# Patient Record
Sex: Female | Born: 1995 | Race: Black or African American
Health system: Northeastern US, Academic
[De-identification: ages and names within clinical notes are randomized; demographics above are authoritative.]

## PROBLEM LIST (undated history)

## (undated) DIAGNOSIS — F32A Depression, unspecified: Secondary | ICD-10-CM

## (undated) DIAGNOSIS — F4310 Post-traumatic stress disorder, unspecified: Secondary | ICD-10-CM

## (undated) DIAGNOSIS — G8929 Other chronic pain: Secondary | ICD-10-CM

## (undated) DIAGNOSIS — T8859XA Other complications of anesthesia, initial encounter: Secondary | ICD-10-CM

## (undated) DIAGNOSIS — J45909 Unspecified asthma, uncomplicated: Secondary | ICD-10-CM

## (undated) DIAGNOSIS — Z6841 Body Mass Index (BMI) 40.0 and over, adult: Secondary | ICD-10-CM

## (undated) DIAGNOSIS — F419 Anxiety disorder, unspecified: Secondary | ICD-10-CM

## (undated) DIAGNOSIS — N946 Dysmenorrhea, unspecified: Secondary | ICD-10-CM

## (undated) DIAGNOSIS — M93003 Unspecified slipped upper femoral epiphysis (nontraumatic), unspecified hip: Secondary | ICD-10-CM

## (undated) DIAGNOSIS — E282 Polycystic ovarian syndrome: Secondary | ICD-10-CM

## (undated) DIAGNOSIS — A549 Gonococcal infection, unspecified: Secondary | ICD-10-CM

## (undated) DIAGNOSIS — L910 Hypertrophic scar: Secondary | ICD-10-CM

## (undated) DIAGNOSIS — N915 Oligomenorrhea, unspecified: Secondary | ICD-10-CM

## (undated) HISTORY — DX: Unspecified asthma, uncomplicated: J45.909

## (undated) HISTORY — DX: Other complications of anesthesia, initial encounter: T88.59XA

## (undated) HISTORY — PX: TONSILLECTOMY: SUR1361

## (undated) HISTORY — PX: HIP SURGERY: SHX245

## (undated) HISTORY — PX: HERNIA REPAIR: SHX51

## (undated) HISTORY — PX: SLIPPED CAPITAL FEMORAL EPIPHYSIS PINNING: SHX391

## (undated) HISTORY — DX: Body Mass Index (BMI) 40.0 and over, adult: Z684

## (undated) HISTORY — PX: HIP OSTEOTOMY: SHX984

## (undated) HISTORY — PX: ORTHOPEDIC SURGERY: SHX850

## (undated) HISTORY — DX: Depression, unspecified: F32.A

## (undated) HISTORY — DX: Dysmenorrhea, unspecified: N94.6

## (undated) HISTORY — DX: Gonococcal infection, unspecified: A54.9

## (undated) HISTORY — DX: Polycystic ovarian syndrome: E28.2

## (undated) HISTORY — PX: TONSILLECTOMY: SHX28A

## (undated) HISTORY — DX: Hypertrophic scar: L91.0

## (undated) HISTORY — DX: Oligomenorrhea, unspecified: N91.5

## (undated) HISTORY — DX: Unspecified slipped upper femoral epiphysis (nontraumatic), unspecified hip: M93.003

---
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
## 2016-01-04 ENCOUNTER — Ambulatory Visit: Payer: Self-pay | Admitting: Optometry

## 2016-01-04 NOTE — Progress Notes (Signed)
This encounter was created in error - please disregard.

## 2016-01-13 ENCOUNTER — Ambulatory Visit: Payer: Medicaid Other | Admitting: Primary Care

## 2016-01-14 ENCOUNTER — Encounter: Payer: Self-pay | Admitting: Primary Care

## 2016-03-03 ENCOUNTER — Ambulatory Visit: Payer: Self-pay | Admitting: Optometry

## 2016-03-03 ENCOUNTER — Encounter: Payer: Self-pay | Admitting: Optometry

## 2016-03-03 DIAGNOSIS — H53022 Refractive amblyopia, left eye: Secondary | ICD-10-CM

## 2016-03-03 DIAGNOSIS — H52203 Unspecified astigmatism, bilateral: Secondary | ICD-10-CM

## 2016-03-03 DIAGNOSIS — H5213 Myopia, bilateral: Secondary | ICD-10-CM

## 2016-03-03 NOTE — Progress Notes (Signed)
Outpatient Visit      Patient name: Mckenzie [name]  DOB: 20-Oct-1995       Age: 20 y.o.  MR#: [id number]    Encounter Date: 03/03/2016    Subjective:      Chief Complaint   Patient presents with    Blurred Vision     HPI     NPV/LEE 1 yr  Pt states she has bad vision DV and NV even with glasses.  Poor historian.   States eyes hurt sometimes, not sure how to explain.  No DM. No hx of eye sx/no fam hx.  Would like eval for CL's, and/or Lasik.       Last edited by Sheppard Plumber, OD on 03/03/2016 12:02 PM.     has a current medication list which includes the following prescription(s): fluticasone, albuterol hfa, albuterol, montelukast, desonide, levonorgestrel, melatonin, biotin, aerochamber plus flo-vu, and ibuprofen.     is allergic to amoxicillin and mupirocin.      Past Medical History:   Diagnosis Date    Asthma     Depression     Dysmenorrhea     Keloid     Malpositioned IUD 01/2015    Oligomenorrhea     PCOS (polycystic ovarian syndrome)     SCFE (slipped capital femoral epiphysis)       Past Surgical History:   Procedure Laterality Date    ORTHOPEDIC SURGERY      multiple surgeries fir slipped femoral issues    PR INSERT INTRAUTERINE DEVICE N/A 02/08/2015    Procedure: IUD INSERTION w/USG, baby dialtors,  and end bx pipelle;  Surgeon: [name], MD;  Location: [facility] MAIN OR;  Service: OBGYN    TONSILLECTOMY      UMBILICAL HERNIA REPAIR  2000        Specialty Problems        Ophthalmology Problems    Meridional amblyopia, left        Myopia with astigmatism, bilateral               ROS     Positive for: Eyes    Last edited by Hauver, Amy on 03/03/2016 11:56 AM. (History)         Objective:     Base Eye Exam     Visual Acuity (Snellen - Linear)      Right Left   Dist cc 20/30 20/30 -1   Near cc J1 OU       Correction:  Glasses      Tonometry (Tonopen, 12:23 PM)      Right Left   Pressure 17 20         Pupils      Pupils APD   Right PERRLA None   Left PERRLA None         Visual Fields      Left  Right   Result Full Full         Extraocular Movement      Right Left   Result Full Full         Neuro/Psych     Oriented x3:  Yes    Mood/Affect:  Normal      Dilation     Both eyes:  2.5% Phenylephrine, 1.0% Tropicamide @ 12:23 PM            Slit Lamp and Fundus Exam     External Exam      Right Left    External Normal ocular adnexae, lacrimal  gland & drainage, orbits Normal ocular adnexae, lacrimal gland & drainage, orbits      Slit Lamp Exam      Right Left    Lids/Lashes Normal structure & position Normal structure & position    Conjunctiva/Sclera Normal bulbar/palpebral, conjunctiva, sclera Normal bulbar/palpebral, conjunctiva, sclera    Cornea Normal epithelium, stroma, endothelium, tear film Normal epithelium, stroma, endothelium, tear film    Anterior Chamber Clear & deep Clear & deep    Iris Normal shape, size, morphology Normal shape, size, morphology    Lens Normal cortex, nucleus, anterior/posterior capsule, clarity Normal cortex, nucleus, anterior/posterior capsule, clarity    Vitreous Clear Clear      Fundus Exam      Right Left    Disc Normal size, appearance, nerve fiber layer Normal size, appearance, nerve fiber layer    C/D Ratio 0.15 0.25    Macula Normal Normal    Vessels Normal Normal    Periphery Normal Normal            Refraction     Wearing Rx      Sphere Cylinder Axis   Right -2.50 -0.75 003   Left -2.50 -2.50 003       Age:  [age]yr    Type:  SVL      Manifest Refraction      Sphere Cylinder Axis Dist   Right -2.75 -0.75 175 20/20   Left -3.00 -2.50 006 20/30+         Final Rx      Sphere Cylinder Axis   Right -2.75 -0.75 175   Left -3.00 -2.50 006       Type:  SVL    Expiration Date:  03/04/2018              Final Rx      Sphere Cylinder Axis   Right -2.75 -0.75 175   Left -3.00 -2.50 006       Type:  SVL    Expiration Date:  03/04/2018                No annotated images are attached to the encounter.      Assessment/Plan:      1. Myopia with astigmatism, bilateral     2. Meridional amblyopia,  left          PLAN:    1-2. Pt given spec Rx. Monitor x 1 yr  Dilated fundus exam unremarkable OU. Monitor

## 2016-06-23 ENCOUNTER — Ambulatory Visit: Payer: Medicaid Other | Attending: Primary Care | Admitting: Primary Care

## 2016-06-23 ENCOUNTER — Encounter: Payer: Self-pay | Admitting: Primary Care

## 2016-06-23 VITALS — BP 124/82 | HR 66 | Temp 97.7°F | Ht 64.0 in | Wt 280.0 lb

## 2016-06-23 DIAGNOSIS — R1030 Lower abdominal pain, unspecified: Secondary | ICD-10-CM | POA: Insufficient documentation

## 2016-06-23 LAB — POCT URINE PREGNANCY: Lot #: 171151

## 2016-06-23 MED ORDER — PROMETHAZINE HCL 12.5 MG PO TABS *I*
12.5000 mg | ORAL_TABLET | ORAL | 0 refills | Status: DC | PRN
Start: 2016-06-23 — End: 2016-06-27

## 2016-06-23 NOTE — Progress Notes (Signed)
[facility]   [facility] Note    Reason For Visit: No chief complaint on file.      Subjective:      Mckenzie [name] is 20 y.o. year old female with PCO S, history of lower back pain, prior abdominal pain in May of this year presenting today with 1 week of worsening lower abdominal pain.    She notes the pain is somewhat different than what she experienced in May.  It initially started in the mid/upper abdomen as crampy discomfort approximately one week ago.  However, over the last 3-4 days she has noted increased anorexia as well as nausea with some vomiting (no hematemesis).  Pain has migrated more over the suprapubic region, and continues to be quite crampy.  She denies any diarrhea, and in fact bowel movements have been normal--though less in the setting of eating less.  She reports the urination has been normal with no urinary frequency, hematuria, dysuria, incomplete voiding.  She denies any fevers, chills or sweats.    She does currently have an IUD, which was placed in late 2016 as a replacement for a previously migrated IUD.  She does note concerned that this may be related to a similar symptom.  In fact, over the last 2 days she has noted some vaginal spotting.  Otherwise no discharge.  She does have unprotected intercourse, but no concerns for STI.    Medications:     Current Outpatient Prescriptions on File Prior to Visit   Medication Sig Dispense Refill    fluticasone (FLOVENT HFA) 110 MCG/ACT inhaler Inhale 1 puff into the lungs 2 times daily 1 Inhaler 5    albuterol HFA 108 (90 BASE) MCG/ACT inhaler Inhale 1-2 puffs into the lungs every 4-6 hours as needed for Shortness of Breath (or cough)   Shake well before each use. 1 Inhaler 2    albuterol (PROVENTIL) (2.5 mg/433mL) 0.083% nebulizer solution Take 3 mLs (2.5 mg total) by nebulization every 4 hours as needed for Shortness of Breath 75 mL 2    montelukast (SINGULAIR) 10 MG tablet Take 1 tablet (10 mg total) by mouth nightly 30 tablet  2    desonide (DESOWEN) 0.05 % cream       MELATONIN PO Take by mouth      BIOTIN PO Take by mouth      Spacer/Aero-Holding Chambers (AEROCHAMBER PLUS FLO-VU) MISC       ibuprofen (ADVIL,MOTRIN) 800 MG tablet Take 800 mg by mouth 3 times daily as needed      Levonorgestrel (MIRENA IU) by Intrauterine route       No current facility-administered medications on file prior to visit.        Medications were reviewed and reconciled today.   Allergies:     Allergies   Allergen Reactions    Amoxicillin     Mupirocin Other (See Comments)     Skin rash and burning sensation       Review of Systems:     Pertinent positives and negatives as per HPI.     Physical Exam:     Vitals:    06/23/16 1104   BP: 124/82   Pulse: 66   Temp: 36.5 C (97.7 F)   SpO2: 98%     Wt Readings from Last 3 Encounters:   12/02/15 114 kg (251 lb 6.4 oz) (>99 %, Z= 2.50)*   11/23/15 114.6 kg (252 lb 9.6 oz) (>99 %, Z= 2.50)*   07/15/15 114.3  kg (252 lb) (>99 %, Z= 2.48)*     * Growth percentiles are based on CDC 2-20 Years data.     BP Readings from Last 3 Encounters:   06/23/16 124/82   12/02/15 116/78   11/23/15 122/70       General:  mildly uncomfortable, though alert, oriented, conversational and nontoxic appearing centrally obese female.  Eyes: Anicteric with normal conjunctiva.  Pupils equally round.  ENMT: Normal pinnae.  Acuity to conversational tones is good.   Neck: Trachea midline.   Pulmonary: Clear to auscultation bilaterally with good air movement. No wheezes, rhonchi, or rales.  Respirations unlabored.  Cardiovascular:  Regular rate and rhythm. No murmurs, rubs, or gallops. No pedal edema.  Abdominal: Soft.  Bowel sounds hypoactive.  Mild to moderate epigastric tenderness and moderate suprapubic tenderness to deeper palpation.  There is no rebound and no guarding.  No Murphy sign.  Difficult to palpate for hepatosplenomegaly with body habitus.      Point-of-care urine pregnancy negative.    Assessment and Plan:     Mckenzie [name] was  seen today for abdominal pain.    Diagnoses and all orders for this visit:    Lower abdominal pain:  20 year old female with new spotting, abdominal pain and nausea and prior history of migrated IUD.  We will pursue ultrasound of the abdomen including uterus to evaluate for possible re-migration.  Pregnancy test today is negative.  Patient reports low personal concern for STI, and lack of systemic symptoms in addition to fairly benign abdominal exam is reassuring for lack of PID.  Urine gonorrhea/chlamydia sent regardless.  She was given promethazine by mouth 4 management of nausea, and will contact the office if she develops any signs of systemic symptoms such as fevers, chills or sweats or worsening pain.  If symptoms do worsen consider ER evaluation/cross-sectional imaging.  -     US abdominal complete; Future  -     POCT Urine Pregnancy  -     N. Gonorrhoeae DNA amplification (Urine-voided); Future  -     Chlamydia plasmid DNA amplification (Urine-voided); Future  -     promethazine (PHENERGAN) 12.5 MG tablet; Take 1 tablet (12.5 mg total) by mouth every 4-6 hours as needed for Nausea  -     US transvaginal; Future  -     Chlamydia plasmid DNA amplification (Urine-voided)  -     N. Gonorrhoeae DNA amplification (Urine-voided)      Follow up pending workup.     Mckenzie Siaaniel Guadalupe Nickless, MD 06/23/2016 11:09 AM

## 2016-06-26 ENCOUNTER — Other Ambulatory Visit: Payer: Self-pay | Admitting: Primary Care

## 2016-06-27 ENCOUNTER — Telehealth: Payer: Self-pay | Admitting: Primary Care

## 2016-06-27 ENCOUNTER — Encounter: Payer: Self-pay | Admitting: Primary Care

## 2016-06-27 ENCOUNTER — Other Ambulatory Visit: Payer: Self-pay | Admitting: Primary Care

## 2016-06-27 ENCOUNTER — Encounter: Payer: Self-pay | Admitting: Gastroenterology

## 2016-06-27 ENCOUNTER — Ambulatory Visit: Payer: Medicaid Other | Attending: Primary Care | Admitting: Primary Care

## 2016-06-27 VITALS — BP 108/62 | HR 66 | Temp 97.4°F | Resp 18 | Ht 64.17 in | Wt 279.0 lb

## 2016-06-27 DIAGNOSIS — R1030 Lower abdominal pain, unspecified: Secondary | ICD-10-CM

## 2016-06-27 DIAGNOSIS — N730 Acute parametritis and pelvic cellulitis: Secondary | ICD-10-CM

## 2016-06-27 LAB — N. GONORRHOEAE DNA AMPLIFICATION: N. gonorrhoeae DNA Amplification: POSITIVE — AB

## 2016-06-27 LAB — CHLAMYDIA PLASMID DNA AMPLIFICATION: Chlamydia Plasmid DNA Amplification: 0

## 2016-06-27 MED ORDER — CEFTRIAXONE SODIUM 250 MG IJ SOLR *I*
250.0000 mg | Freq: Once | INTRAMUSCULAR | Status: AC
Start: 2016-06-27 — End: 2016-06-27
  Administered 2016-06-27: 250 mg via INTRAMUSCULAR

## 2016-06-27 MED ORDER — PROMETHAZINE HCL 12.5 MG PO TABS *I*
12.5000 mg | ORAL_TABLET | ORAL | 0 refills | Status: DC | PRN
Start: 2016-06-27 — End: 2016-07-04

## 2016-06-27 MED ORDER — DOXYCYCLINE MONOHYDRATE 100 MG PO CAPS *I*
100.0000 mg | ORAL_CAPSULE | Freq: Two times a day (BID) | ORAL | 0 refills | Status: AC
Start: 2016-06-27 — End: 2016-07-11

## 2016-06-27 MED ORDER — CEFTRIAXONE IN LIDOCAINE 1% 350 MG/ML IM *I*
250.0000 mg | Freq: Once | INTRAMUSCULAR | Status: AC
Start: 2016-06-27 — End: 2016-06-27

## 2016-06-27 NOTE — Telephone Encounter (Signed)
Results reviewed with patient.  Notified of appointment.  Patient will check with work and call back if not able to come in.

## 2016-06-27 NOTE — Addendum Note (Signed)
Addended by: Ellamae SiaKROENING, Parminder Trapani A on: 06/27/2016 03:34 PM     Modules accepted: Orders

## 2016-06-27 NOTE — Telephone Encounter (Signed)
Pt called back, transferred call to VS

## 2016-06-27 NOTE — Addendum Note (Signed)
Addended by: Lenetta QuakerKELLY, Jillene Wehrenberg A on: 06/27/2016 02:57 PM     Modules accepted: Orders

## 2016-06-27 NOTE — Telephone Encounter (Signed)
-----   Message from Ellamae Siaaniel Kroening, MD sent at 06/27/2016  8:44 AM EST -----  Please call patient. She needs to be seen today regarding her urine results. Book with myself or Dr Porfirio Mylaricilline as available.

## 2016-06-27 NOTE — Progress Notes (Signed)
[facility]   Progress Note    Reason For Visit:   Chief Complaint   Patient presents with    Results     STI results, treatment options       Subjective:      Mckenzie [name] is 20 y.o. year old female with Recent visit for abdominal pain, now found to be gonorrhea positive.    Since her visit this Friday, she notes mild resolution in the abdominal discomfort since starting the Phenergan.  However, it is not resolved.  She notes one female partner recently, and reports that she has informed him that he needs evaluation and treatment as well.  Personally, she denies any fevers, chills or sweats.     With regard to up reported amoxicillin allergy, she reports it was a very long time ago in her childhood when she may have had difficulty breathing in association with a rash.  However, her mother is not sure if this was her that experienced a reaction or her brother.  Review of records indicates admission and follow-up for chronically infected wound with long-term ceftriaxone which she tolerated well in 2010.  She is confident that if she did have an amoxicillin allergy was before this time.  Medications:     Current Outpatient Prescriptions on File Prior to Visit   Medication Sig Dispense Refill    fluticasone (FLOVENT HFA) 110 MCG/ACT inhaler Inhale 1 puff into the lungs 2 times daily 1 Inhaler 5    albuterol HFA 108 (90 BASE) MCG/ACT inhaler Inhale 1-2 puffs into the lungs every 4-6 hours as needed for Shortness of Breath (or cough)   Shake well before each use. 1 Inhaler 2    montelukast (SINGULAIR) 10 MG tablet Take 1 tablet (10 mg total) by mouth nightly 30 tablet 2    desonide (DESOWEN) 0.05 % cream       Levonorgestrel (MIRENA IU) by Intrauterine route      MELATONIN PO Take by mouth      BIOTIN PO Take by mouth      Spacer/Aero-Holding Chambers (AEROCHAMBER PLUS FLO-VU) MISC       ibuprofen (ADVIL,MOTRIN) 800 MG tablet Take 800 mg by mouth 3 times daily as needed      albuterol  (PROVENTIL) (2.5 mg/893mL) 0.083% nebulizer solution Take 3 mLs (2.5 mg total) by nebulization every 4 hours as needed for Shortness of Breath 75 mL 2     No current facility-administered medications on file prior to visit.        Medications were reviewed and reconciled today with patient.   Allergies:     Allergies   Allergen Reactions    Amoxicillin     Mupirocin Other (See Comments)     Skin rash and burning sensation       Review of Systems:     Pertinent positives and negatives as per HPI.     Physical Exam:     Vitals:    06/27/16 1137   BP: 108/62   Pulse: 66   Resp: 18   Temp: 36.3 C (97.4 F)   SpO2: 98%   Weight: 126.6 kg (279 lb)   Height: 1.63 m (5' 4.17")     Wt Readings from Last 3 Encounters:   06/27/16 126.6 kg (279 lb) (>99 %, Z= 2.73)*   06/23/16 127 kg (280 lb) (>99 %, Z= 2.73)*   12/02/15 114 kg (251 lb 6.4 oz) (>99 %, Z= 2.50)*     *  Growth percentiles are based on CDC 2-20 Years data.     BP Readings from Last 3 Encounters:   06/27/16 108/62   06/23/16 124/82   12/02/15 116/78       General: In no apparent distress. Alert, oriented, texting, nontoxic African-American female  Eyes: Anicteric with normal conjunctiva.  Pupils equally round.  Neck: Trachea midline.  Pulmonary: Respirations unlabored.    Assessment and Plan:     Mckenzie [name] was seen today for results.    Diagnoses and all orders for this visit:    PID (acute pelvic inflammatory disease):  PID without worsening in pain over the weekend and without fevers in this 20 year old female now with positive gonorrhea on urine testing.  250 mg ceftriaxone IM to be given today, followed by 2 weeks 100 mg twice a day doxycycline.  She will follow up at her college [facility] for test of cure, and there or at this office if no improvement by the end of treatment or any worsening at any time.  She is returning to school and 1.5 weeks.  -Single partner informed per patient.  -Avoid sexual contacts until treatment complete.  -Follow up in the  office 30 minutes with history of amoxicillin allergy, though as per HPI this is low risk given her prior tolerance to ceftriaxone.  -     cefTRIAXone (ROCEPHIN) 350 mg/mL IM injection in lidocaine 1%; Inject 0.71 mLs (249 mg total) into the muscle once for 1 dose  -     doxycycline (MONODOX) 100 MG capsule; Take 1 capsule (100 mg total) by mouth 2 times daily for 14 days  -     HIV 1&2 antigen/antibody; Future  -     Syphilis screen; Future    Lower abdominal pain:  refill  -     promethazine (PHENERGAN) 12.5 MG tablet; Take 1 tablet (12.5 mg total) by mouth every 4-6 hours as needed for Nausea    Follow up as needed.     Mckenzie Siaaniel Camisha Srey, MD 06/27/2016 12:14 PM

## 2016-06-27 NOTE — Telephone Encounter (Signed)
Left message for patient to return my call.  Scheduled at 11:30 am with Dr. Forde DandyKroening.

## 2016-06-30 ENCOUNTER — Encounter: Payer: Self-pay | Admitting: Emergency Medicine

## 2016-06-30 ENCOUNTER — Emergency Department
Admission: EM | Admit: 2016-06-30 | Discharge: 2016-06-30 | Disposition: A | Discharge status: Home / Self Care | Payer: Auto Insurance (includes no fault) | Source: Ambulatory Visit | Attending: Emergency Medicine | Admitting: Emergency Medicine

## 2016-06-30 DIAGNOSIS — R51 Headache: Secondary | ICD-10-CM | POA: Insufficient documentation

## 2016-06-30 DIAGNOSIS — Y998 Other external cause status: Secondary | ICD-10-CM | POA: Insufficient documentation

## 2016-06-30 DIAGNOSIS — M542 Cervicalgia: Secondary | ICD-10-CM | POA: Insufficient documentation

## 2016-06-30 DIAGNOSIS — Y9389 Activity, other specified: Secondary | ICD-10-CM | POA: Insufficient documentation

## 2016-06-30 DIAGNOSIS — Z3202 Encounter for pregnancy test, result negative: Secondary | ICD-10-CM | POA: Insufficient documentation

## 2016-06-30 DIAGNOSIS — Y92411 Interstate highway as the place of occurrence of the external cause: Secondary | ICD-10-CM | POA: Insufficient documentation

## 2016-06-30 LAB — POCT URINE PREGNANCY
Exp date: 23
Lot #: 123265

## 2016-06-30 NOTE — ED Notes (Signed)
Family at bedside. 
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
## 2016-07-04 ENCOUNTER — Ambulatory Visit: Payer: Medicaid Other | Attending: Family Medicine | Admitting: Family Medicine

## 2016-07-04 ENCOUNTER — Encounter: Payer: Self-pay | Admitting: Family Medicine

## 2016-07-04 VITALS — BP 118/76 | HR 79 | Ht 64.0 in | Wt 278.0 lb

## 2016-07-04 DIAGNOSIS — S39012A Strain of muscle, fascia and tendon of lower back, initial encounter: Secondary | ICD-10-CM

## 2016-07-04 DIAGNOSIS — S161XXA Strain of muscle, fascia and tendon at neck level, initial encounter: Secondary | ICD-10-CM

## 2016-07-04 DIAGNOSIS — S060X9A Concussion with loss of consciousness of unspecified duration, initial encounter: Secondary | ICD-10-CM

## 2016-07-04 DIAGNOSIS — S060XAA Concussion with loss of consciousness status unknown, initial encounter: Secondary | ICD-10-CM

## 2016-07-04 LAB — PCMH DEPRESSION ASSESSMENT
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
## 2016-07-05 ENCOUNTER — Telehealth: Payer: Self-pay | Admitting: Primary Care

## 2016-07-05 ENCOUNTER — Encounter: Payer: Self-pay | Admitting: Primary Care

## 2016-07-05 DIAGNOSIS — Z0000 Encounter for general adult medical examination without abnormal findings: Secondary | ICD-10-CM

## 2016-07-05 NOTE — Telephone Encounter (Signed)
Please contact patient to remind them to have fasting lab work done before upcoming physical appointment.

## 2016-07-06 NOTE — Telephone Encounter (Signed)
Pt called back, unsure what the lab work that was ordered was for, informed her that a message was received to remind her of this, and that if labs are done before an upcoming appt then results can be reviewed at the appt, and gave her [facility] lab locations and hours to choose from

## 2016-07-06 NOTE — Telephone Encounter (Signed)
Pt notified reg lab work

## 2016-07-11 ENCOUNTER — Other Ambulatory Visit
Admission: RE | Admit: 2016-07-11 | Discharge: 2016-07-11 | Disposition: A | Discharge status: Home / Self Care | Payer: Self-pay | Source: Ambulatory Visit | Attending: Primary Care | Admitting: Primary Care

## 2016-07-11 ENCOUNTER — Other Ambulatory Visit
Admission: RE | Admit: 2016-07-11 | Discharge: 2016-07-11 | Disposition: A | Discharge status: Home / Self Care | Payer: Medicaid Other | Source: Ambulatory Visit

## 2016-07-11 DIAGNOSIS — Z0000 Encounter for general adult medical examination without abnormal findings: Secondary | ICD-10-CM

## 2016-07-11 DIAGNOSIS — D72819 Decreased white blood cell count, unspecified: Secondary | ICD-10-CM

## 2016-07-11 DIAGNOSIS — R779 Abnormality of plasma protein, unspecified: Secondary | ICD-10-CM

## 2016-07-11 DIAGNOSIS — R109 Unspecified abdominal pain: Secondary | ICD-10-CM

## 2016-07-11 DIAGNOSIS — N730 Acute parametritis and pelvic cellulitis: Secondary | ICD-10-CM

## 2016-07-11 LAB — COMPREHENSIVE METABOLIC PANEL
ALT: 25 U/L (ref 0–35)
AST: 17 U/L (ref 0–35)
Albumin: 4.4 g/dL (ref 3.5–5.2)
Alk Phos: 128 U/L (ref 50–130)
Anion Gap: 14 (ref 7–16)
Bilirubin,Total: 0.5 mg/dL (ref 0.0–1.2)
CO2: 25 mmol/L (ref 20–28)
Calcium: 9.6 mg/dL (ref 9.0–10.4)
Chloride: 103 mmol/L (ref 96–108)
Creatinine: 0.89 mg/dL (ref 0.50–1.00)
GFR,Black: 108 *
GFR,Caucasian: 94 *
Glucose: 100 mg/dL — ABNORMAL HIGH (ref 60–99)
Lab: 15 mg/dL (ref 6–20)
Potassium: 4.5 mmol/L (ref 3.3–5.1)
Sodium: 142 mmol/L (ref 133–145)
Total Protein: 6.9 g/dL (ref 6.3–7.7)

## 2016-07-11 LAB — CBC AND DIFFERENTIAL
Baso # K/uL: 0 10*3/uL (ref 0.0–0.1)
Basophil %: 0.4 %
Eos # K/uL: 0.1 10*3/uL (ref 0.0–0.4)
Eosinophil %: 1 %
Hematocrit: 41 % (ref 34–45)
Hemoglobin: 12.9 g/dL (ref 11.2–15.7)
IMM Granulocytes #: 0 10*3/uL (ref 0.0–0.1)
IMM Granulocytes: 0.2 %
Lymph # K/uL: 1.7 10*3/uL (ref 1.2–3.7)
Lymphocyte %: 34.4 %
MCH: 27 pg/cell (ref 26–32)
MCHC: 32 g/dL (ref 32–36)
MCV: 85 fL (ref 79–95)
Mono # K/uL: 0.4 10*3/uL (ref 0.2–0.9)
Monocyte %: 8.9 %
Neut # K/uL: 2.7 10*3/uL (ref 1.6–6.1)
Nucl RBC # K/uL: 0 10*3/uL (ref 0.0–0.0)
Nucl RBC %: 0 /100 WBC (ref 0.0–0.2)
Platelets: 270 10*3/uL (ref 160–370)
RBC: 4.8 MIL/uL (ref 3.9–5.2)
RDW: 15.4 % — ABNORMAL HIGH (ref 11.7–14.4)
Seg Neut %: 55.1 %
WBC: 5 10*3/uL (ref 4.0–10.0)

## 2016-07-11 LAB — MICROALBUMIN, URINE, RANDOM
Creatinine,UR: 468 mg/dL — ABNORMAL HIGH (ref 20–300)
Microalb/Creat Ratio: 6.6 mg MA/g CR (ref 0.0–29.9)
Microalbumin,UR: 3.08 mg/dL

## 2016-07-11 LAB — TSH: TSH: 3.27 u[IU]/mL (ref 0.27–4.20)

## 2016-07-11 LAB — MULTIPLE ORDERING DOCS

## 2016-07-11 LAB — H. PYLORI ANTIGEN, STOOL: H. pylori Stool Antigen EIA: POSITIVE — AB

## 2016-07-11 LAB — BILIRUBIN, DIRECT: Bilirubin,Direct: <0.2 mg/dL (ref 0.0–0.3)

## 2016-07-11 LAB — HM HIV SCREENING OFFERED

## 2016-07-12 ENCOUNTER — Encounter: Payer: Self-pay | Admitting: Family Medicine

## 2016-07-12 ENCOUNTER — Ambulatory Visit: Payer: Medicaid Other | Attending: Primary Care | Admitting: Family Medicine

## 2016-07-12 VITALS — BP 122/78 | HR 94 | Resp 20 | Ht 65.0 in | Wt 279.0 lb

## 2016-07-12 DIAGNOSIS — Z8619 Personal history of other infectious and parasitic diseases: Secondary | ICD-10-CM

## 2016-07-12 DIAGNOSIS — Z0000 Encounter for general adult medical examination without abnormal findings: Secondary | ICD-10-CM

## 2016-07-12 DIAGNOSIS — A048 Other specified bacterial intestinal infections: Secondary | ICD-10-CM

## 2016-07-12 DIAGNOSIS — Z111 Encounter for screening for respiratory tuberculosis: Secondary | ICD-10-CM

## 2016-07-12 LAB — SYPHILIS SCREEN
Syphilis Screen: NEGATIVE
Syphilis Status: NONREACTIVE

## 2016-07-12 LAB — HIV 1&2 ANTIGEN/ANTIBODY: HIV 1&2 ANTIGEN/ANTIBODY: NONREACTIVE

## 2016-07-12 MED ORDER — CLARITHROMYCIN 500 MG PO TABS *I*
500.0000 mg | ORAL_TABLET | Freq: Two times a day (BID) | ORAL | 0 refills | Status: AC
Start: 2016-07-12 — End: 2016-07-26

## 2016-07-12 MED ORDER — METRONIDAZOLE 500 MG PO TABS *I*
500.0000 mg | ORAL_TABLET | Freq: Three times a day (TID) | ORAL | 0 refills | Status: AC
Start: 2016-07-12 — End: 2016-07-26

## 2016-07-12 MED ORDER — OMEPRAZOLE 20 MG PO CPDR *I*
20.0000 mg | DELAYED_RELEASE_CAPSULE | Freq: Two times a day (BID) | ORAL | 0 refills | Status: AC
Start: 2016-07-12 — End: 2016-07-26
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
## 2016-07-12 NOTE — Patient Instructions (Addendum)
Omeprazole, Clarithromycin, and Metronidazole as directed for 14 days.  No alcohol.  We will repeat a stool test once medications have been completed for 4+ weeks    A PPD was placed today.  It needs to be read in 48-72 hrs.  You will need to have a nurse at the student health center check it on Friday    Eat a well balanced diet  -Plenty of fruits, vegetables, whole grains  -Lean sources of protein  -Limit saturated fats/simple sugars    Aerobic exercise for 40 minutes 3-4 days/week

## 2016-07-13 ENCOUNTER — Encounter: Payer: Self-pay | Admitting: Family Medicine

## 2016-07-13 ENCOUNTER — Ambulatory Visit: Payer: Medicaid Other | Attending: Primary Care | Admitting: Family Medicine

## 2016-07-13 DIAGNOSIS — S39012A Strain of muscle, fascia and tendon of lower back, initial encounter: Secondary | ICD-10-CM

## 2016-07-13 DIAGNOSIS — S060X9A Concussion with loss of consciousness of unspecified duration, initial encounter: Secondary | ICD-10-CM

## 2016-07-13 DIAGNOSIS — S139XXA Sprain of joints and ligaments of unspecified parts of neck, initial encounter: Secondary | ICD-10-CM

## 2016-07-13 DIAGNOSIS — S060XAA Concussion with loss of consciousness status unknown, initial encounter: Secondary | ICD-10-CM

## 2016-07-13 MED ORDER — CYCLOBENZAPRINE HCL 5 MG PO TABS *I*
5.0000 mg | ORAL_TABLET | Freq: Three times a day (TID) | ORAL | 0 refills | Status: AC | PRN
Start: 2016-07-13 — End: ?
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
## 2016-07-15 ENCOUNTER — Encounter: Payer: Self-pay | Admitting: Family Medicine

## 2016-07-28 ENCOUNTER — Telehealth: Payer: Self-pay | Admitting: Primary Care

## 2016-07-28 NOTE — Telephone Encounter (Signed)
ok 

## 2016-07-28 NOTE — Telephone Encounter (Signed)
Please let patient know letter faxed.

## 2016-07-28 NOTE — Telephone Encounter (Signed)
PT NOTIFIED  
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
## 2016-07-28 NOTE — Telephone Encounter (Signed)
Ok to write? 

## 2016-08-17 ENCOUNTER — Telehealth: Payer: Self-pay | Admitting: Primary Care

## 2016-08-17 NOTE — Telephone Encounter (Signed)
Patient was tested positive for h-pyloric 07/13/2016 while she was home from college which she was treated for. She never felt 100% after being treated and she has completed her medication. She is having  increased acid reflux,  Has been nauseous,  vomiting and has abdominal pain. No Fever. She is in [place] right now for college and she is asking what she should do. She has not gone to any Urgent Care or [facility].

## 2016-08-17 NOTE — Telephone Encounter (Signed)
Spoke with patient:  -Advised patient to go to Urgent Care or call GI provider for further advice.

## 2017-01-25 ENCOUNTER — Emergency Department
Admission: EM | Admit: 2017-01-25 | Discharge: 2017-01-25 | Disposition: A | Discharge status: Home / Self Care | Payer: Medicaid Other | Source: Ambulatory Visit | Attending: Emergency Medicine | Admitting: Emergency Medicine

## 2017-01-25 ENCOUNTER — Encounter: Payer: Self-pay | Admitting: Emergency Medicine

## 2017-01-25 DIAGNOSIS — Y9289 Other specified places as the place of occurrence of the external cause: Secondary | ICD-10-CM | POA: Insufficient documentation

## 2017-01-25 DIAGNOSIS — L538 Other specified erythematous conditions: Secondary | ICD-10-CM

## 2017-01-25 DIAGNOSIS — S50862A Insect bite (nonvenomous) of left forearm, initial encounter: Secondary | ICD-10-CM | POA: Insufficient documentation

## 2017-01-25 DIAGNOSIS — Y9389 Activity, other specified: Secondary | ICD-10-CM | POA: Insufficient documentation

## 2017-01-25 DIAGNOSIS — W57XXXA Bitten or stung by nonvenomous insect and other nonvenomous arthropods, initial encounter: Secondary | ICD-10-CM | POA: Insufficient documentation

## 2017-01-25 DIAGNOSIS — Y990 Civilian activity done for income or pay: Secondary | ICD-10-CM | POA: Insufficient documentation

## 2017-01-25 DIAGNOSIS — T63481A Toxic effect of venom of other arthropod, accidental (unintentional), initial encounter: Secondary | ICD-10-CM

## 2017-01-25 NOTE — ED Triage Notes (Signed)
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 Pt states she did not feel it happen initially, but the left wrist has a swollen area which is painful to touch. Denies fevers or N/V/D. Area is swollen but not reddened or hot.       Triage Note   Camelia EngKatie Terren Haberle, RN

## 2017-01-25 NOTE — ED Notes (Signed)
Pt discharge home.  No complaints or questions at the time of discharge .  Instructions and follow up reviewed.  Pt ambulated to waiting room

## 2017-01-25 NOTE — Discharge Instructions (Signed)
Apply hydrocortisone cream to affected area three times a day.     Follow up with primary care as needed.     Return to ED for worsening symptoms, fever, increased redness/swelling, or any other concerns.

## 2017-01-25 NOTE — ED Notes (Signed)
Pt here for left wrist pain.  Pt states" I have an insect bite on left wrist".  Left wrist painful and swollen

## 2017-01-25 NOTE — ED Provider Notes (Signed)
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 Pt states she did not feel it happen initially, but the left wrist has a swollen area which is painful to touch. Denies fevers or N/V/D. Area is swollen but not reddened or hot.     HPI Comments: 21 year old with history of asthma, depression presents to ED complaining of insect bite to left forearm.  States she was bitten by something at about 0300 this morning, however did not see what it was that bit her.  States she developed redness and swelling in the area, and states area has burning pain.  No other bites elsewhere.  No aggravating or alleviating factors.  Has not been taking anything for symptoms.  Denies fever.      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 pylori infection A04.8            Past Surgical History:   Procedure Laterality Date    ORTHOPEDIC SURGERY      multiple surgeries fir slipped femoral issues    PR INSERT INTRAUTERINE DEVICE N/A 02/08/2015    Procedure: IUD INSERTION w/USG, baby dialtors,  and end bx pipelle;  Surgeon: Leonie DouglasMorrison, Jaclyn, MD;  [facility] MA[facility];  Service: OBGYN    TONSILLECTOMY      UMBILICAL HERNIA REPAIR  2000     Family  History   Problem Relation Age of Onset    Asthma Mother     Rashes / Skin problems Mother      Eczema    Multiple Sclerosis Mother     Other Mother      h.pylori    Rashes / Skin problems Father      Eczema    Hypertension Father     Osteoarthritis Maternal Grandmother     Diabetes Maternal Grandmother     Osteoarthritis Paternal Grandmother     Hypertension Paternal Grandmother     High cholesterol Paternal Grandmother     Stroke Paternal Grandmother     Cancer Maternal Grandfather      Pancreatic cancer    Heart failure Maternal Grandfather     Breast cancer Other      MGGM, lung    Rashes / Skin problems Brother      eczema    Other Brother      h.pylori    No Known Problems Brother  Social History   Substance Use Topics    Smoking status: Never Smoker    Smokeless tobacco: Never Used    Alcohol use Yes      Comment: occasional     Living Situation     Questions Responses    Patient lives with     Homeless     Caregiver for other family member     External Services     Employment     Domestic Violence Risk                 Review of Systems   Review of Systems   Constitutional: Negative for fever.   Respiratory: Negative for cough and shortness of breath.    Skin:        Positive for insect bite to left forearm.   Neurological: Negative for numbness.       Physical Exam     Triage Vitals  Triage Start: Start, (01/25/17 2132)   First Recorded BP: (!) 161/93, Resp: 18, Temp: 36.4 C (97.5 F), Temp src: TEMPORAL Oxygen Therapy SpO2: 98 %, Oximetry Source: Rt Hand, O2 Device: None (Room air), Heart Rate: (!) 117, (01/25/17 2135)  .  First Pain Reported  0-10 Scale: 6, Pain Location/Orientation: Wrist Left, (01/25/17 2135)       Physical Exam   Constitutional: She is oriented to person, place, and time. She appears well-developed and well-nourished. No distress.   HENT:   Head: Normocephalic and atraumatic.   Right Ear: External ear normal.   Left Ear: External ear normal.   Neck: Normal  range of motion. Neck supple.   Cardiovascular: Normal rate and regular rhythm.    Pulmonary/Chest: Effort normal. No respiratory distress.   Musculoskeletal: Normal range of motion. She exhibits no deformity.   Neurological: She is alert and oriented to person, place, and time.   Skin: Skin is dry. No rash noted.   Small, 5mm, mildly erythematous papule noted to dorsal aspect of the left forearm, with minimal surrounding swelling. No warmth, no surrounding erythema   Psychiatric: She has a normal mood and affect. Her behavior is normal.   Nursing note and vitals reviewed.      Medical Decision Making        Initial Evaluation:  ED First Provider Contact     Date/Time Event User Comments    01/25/17 2138 ED First Provider Contact Maryjean Morn N Initial Face to Face Provider Contact           Patient seen by me on arrival date of 01/25/2017.    Assessment:  21 y.o.female comes to the ED with Insect bite to left forearm.      Differential Diagnosis includes:  Insect bite, local allergic reaction, cellulitis      Plan: No infection on exam.  Exam consistent with mild local reaction.  Advised topical hydrocortisone.  Encouraged to return for fever, increased redness/swelling.    Thersa Salt, PA    Supervising physician Alan Mulder was immediately available        Thersa Salt, [place]  01/25/17 2202

## 2017-04-02 ENCOUNTER — Ambulatory Visit: Payer: Medicaid Other | Attending: Family Medicine | Admitting: Family Medicine

## 2017-04-02 ENCOUNTER — Encounter: Payer: Self-pay | Admitting: Family Medicine

## 2017-04-02 VITALS — BP 128/78 | HR 88 | Resp 20 | Ht 64.5 in | Wt 274.6 lb

## 2017-04-02 DIAGNOSIS — G4700 Insomnia, unspecified: Secondary | ICD-10-CM

## 2017-04-02 DIAGNOSIS — M67431 Ganglion, right wrist: Secondary | ICD-10-CM

## 2017-04-02 NOTE — Progress Notes (Signed)
Subjective:     Patient ID: Mckenzie [name] is a 21 y.o. female.    HPI Comments: 21 year-old female presents with a lump on her wrist  PMH significant for PCOS, asthma, back pain, H.pylori  She arrives 19 minutes late for this 20 minute appointment  Noticed a lump on her right wrist a couple months ago  Occasionally tender to the touch.  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  Never rested in AM  Snores.  FH of sleep issues?  Melatonin worked "for a second"        Allergies, medications, and problem list were reviewed with the patient and updated in Epic as appropriate.    Allergy History as of 04/02/17     NO KNOWN DRUG ALLERGY       Noted Status Severity Type Reaction    10/28/12 1422 Defraine, Cheral Marker 12/16/07 Deleted       Comments:        09/11/11 1412 Purvis Kilts, MD 12/16/07 Active       Comments:        10/24/10 1430 Edi Conversion, Allergies 12/16/07 Active       Comments:  Created by Conversion - 0;            NO KNOWN LATEX ALLERGY       Noted Status Severity Type Reaction    10/28/12 1422 Defraine, Cheral Marker 12/16/07 Deleted       09/11/11 1412 Purvis Kilts, MD 12/16/07 Active       10/24/10 1430 Edi Conversion, Allergies 12/16/07 Active       Comments:  Created by Conversion - 0;            AMOXICILLIN       Noted Status Severity Type Reaction    11/18/11 2122 Ronney Asters, RN 11/18/11 Active  Allergy           MUPIROCIN       Noted Status Severity Type Reaction    12/23/14 1349 Lenore Cordia, [place] 12/23/14 Active Low  Other (See Comments)    Comments:  Skin rash and burning sensation                 Current Outpatient Prescriptions   Medication Sig    fluticasone (FLOVENT HFA) 110 MCG/ACT  inhaler Inhale 1 puff into the lungs 2 times daily    Levonorgestrel (MIRENA IU) by Intrauterine route    cyclobenzaprine (FLEXERIL) 5 MG tablet Take 1 tablet (5 mg total) by mouth 3 times daily as needed for Muscle spasms   May cause drowsiness    albuterol HFA 108 (90 BASE) MCG/ACT inhaler Inhale 1-2 puffs into the lungs every 4-6 hours as needed for Shortness of Breath (or cough)   Shake well before each use.    albuterol (PROVENTIL) (2.5 mg/35mL) 0.083% nebulizer solution Take 3 mLs (2.5 mg total) by nebulization every 4 hours as needed for Shortness of Breath    Spacer/Aero-Holding Chambers (AEROCHAMBER PLUS FLO-VU) MISC      No current facility-administered medications for this visit.        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 pylori infection A04.8       Review of Systems      As per HPI     Objective:    BP 128/78 (BP Location: Right arm, Patient Position: Sitting, Cuff Size: large adult)   Pulse 88   Resp 20   Ht 1.638 m (5' 4.5")   Wt 124.6 kg (274 lb 9.6 oz)   BMI 46.41 kg/m2     Physical Exam   Constitutional: She is oriented to person, place, and time. She appears well-developed and well-nourished. No distress.   Morbidly obese   Cardiovascular: Normal rate.    Pulmonary/Chest: Effort normal.   Musculoskeletal:   Dorsal aspect of right wrist with a small, well defined nodular lesion; skin-colored, firm, non-tender   Neurological: She is alert and oriented to person, place, and time.   Vitals reviewed.        Assessment / Plan:        Ganglion cyst of dorsum of right wrist    Patient presents with a lump on the dorsal aspect of her right wrist consistent with a ganglion cyst.   Discussed diagnosis and treatment options- observation, aspiration, excision.  The cyst is small and not terribly bothersome, so she defers additional treatment at this time.  Patient will continue to monitor for changes in size, ROM, etc      Insomnia, unspecified type    Longstanding history of early and late insomnia.  Strong suspicion for sleep apnea.  Untreated mood disorder is another leading consideration.  Referred her to sleep medicine.    -     AMB REFERRAL TO ADULT SLEEP MED -CONSULT

## 2017-06-04 ENCOUNTER — Ambulatory Visit: Payer: Medicaid Other | Admitting: Sleep Medicine

## 2017-11-19 ENCOUNTER — Telehealth: Payer: Self-pay | Admitting: Reproductive Endocrinology and Infertility

## 2017-11-19 NOTE — Telephone Encounter (Signed)
Returned call to patient. Reports has already spoke to Darrol Angel, NP. No questions or concerns.     Gus Height, MD  Obstetrics and Gynecology, PGY-1  Pager: [phone]

## 2017-11-23 ENCOUNTER — Telehealth: Payer: Self-pay

## 2017-11-23 NOTE — Telephone Encounter (Addendum)
Pt calling to state that she hadn't been sexually active for quite a while and had intercourse about 3 weeks ago and had a lot of pain. States she exp some vaginal bleeding for about a week after. States she was using tampons and wasn't bleeding a lot but blood was only noted mainly one side of tampon. States she had intercourse again last night and it was painful but definitely not as bad as the last time. Noted spotting after but none today. Pt wondering if she should have her IUD checked or maybe she had a small tear in her vagina. Offered pt appt but she states she is in [place]. Doesn't have a GYN provider there. Also revd with pt if there was any concerns for STI. Pt states she doesn't think so but was thinking maybe she should get checked just in case. Denies any fever / chills/ vaginal discharge or odor or other concerns. Revd with pt that it would be good to find a local GYN. Pt agrees. States she is going to see about getting STI check so encouraged pt to discuss with them regarding her bleeding and painful intercourse. Pt agrees. Pt also would like follow up appt at our office when she comes home for a week the end of July. Appt given.

## 2018-02-06 ENCOUNTER — Ambulatory Visit: Payer: Self-pay | Admitting: Obstetrics and Gynecology

## 2018-12-16 ENCOUNTER — Encounter: Payer: Self-pay | Admitting: Primary Care

## 2018-12-16 NOTE — Telephone Encounter (Signed)
Error

## 2019-07-11 DIAGNOSIS — A749 Chlamydial infection, unspecified: Secondary | ICD-10-CM

## 2019-07-11 HISTORY — DX: Chlamydial infection, unspecified: A74.9
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
## 2019-10-05 ENCOUNTER — Other Ambulatory Visit: Payer: Self-pay | Admitting: Pulmonary Disease

## 2019-10-05 DIAGNOSIS — Z23 Encounter for immunization: Secondary | ICD-10-CM

## 2020-04-26 NOTE — Progress Notes (Signed)
 Patient: Ramonica, Grigg  DOB: [birth year]  MRN: [id number]  Date of Visit: 04/26/2020  Attending Physician: Artist DOROTHA Claudene  History of Present Illness Ms.SABRA Claudene is a very pleasant 24 y.o. patient with a several year history of progressive left hip pain. There is no precipitating event or trauma. The pain is located predominantly in the groin and aggravated by weight bearing. Walking even short distances can be painful. However, it can also awaken the patient at night. Significant loss in range of motion making it difficult to cross legs and don socks/shoes.   The patient has tried the following interventions without sustained functional improvement:  Structured exercise/physical therapy program NSAIDs and or tylenol  Cane in the contralateral hand Intra-articular steroid injections with minimal improvement   Quality of life is negatively impacted with daily tasks being more difficult. The patient would like to talk about surgical treatment options.   She had bilateral CV surgery performed over 10 years ago the left side had hardware removed and eventual osteotomy this was all performed in [place].   PMH/PSH: No past medical history on file. There is no problem list on file for this patient.  No past surgical history on file.  MEDS: Scheduled Meds: Continuous Infusions: PRN Meds: Current Meds:No current outpatient medications on file.   ALLERGIES: No Known Allergies   Social History:  Social History   Socioeconomic History  . Marital status: Single    Spouse name: Not on file  . Number of children: Not on file  . Years of education: Not on file  . Highest education level: Not on file  Social Needs  . Financial resource strain: Not on file  . Food insecurity - worry: Not on file  . Food insecurity - inability: Not on file  . Transportation needs - medical: Not on file  . Transportation needs - non-medical: Not on file  Occupational History  . Not on file   Tobacco Use  . Smoking status: Never Smoker  . Smokeless tobacco: Never Used  Substance and Sexual Activity  . Alcohol use: No    Frequency: Never  . Drug use: No  . Sexual activity: Not on file  Other Topics Concern  . Not on file  Social History Narrative  . Not on file     Family History:  No family history on file.   Review of Systems: No personal history of DVT, PE. 12 point ROS otherwise negative other than reported in HPI.  Physical Examination: Patient is alert and oriented x 3 and appears well nourished and appropriate for today's visit.  Gait: The patient walks with antalgic gait. Hips: Apparent leg lengths are equal. There is NO tenderness over greater trochanteric region and pain with log roll. ROM is limited by pain L>>R. (at 90 degrees of flexion) Right- IR: 10, ER 45;  Left- IR:10, ER 45 Peripheral vasculature: Bilateral 2+ dorsal pedis pulses; bilateral 2+ posterior tibial pulses; No lower extremity edema Skin: Skin appears to be intact in both upper and lower extremities.  There does not appear to be any ulceration or other non-healing wounds.  Radiographs: AP/lateral hip and pelvis: Imaging was reviewed with the patient. There are degenerative changes of the bilateral hips with joint space narrowing, subchondral sclerosis, and osteophyte formation. There is retained screws in the right hip, these have been removed on the left. There is no radiographic evidence of AVN, fracture, or dislocation.    Assessment and Plan?: The patient has advanced degenerative changes of  the hips, L>R, with prior SCFE, however due to her BMI she will require optimization prior to surgery.   We discussed the diagnosis and treatment options in detail with the patient. Patient may one day benefit from an elective total joint replacement however has not yet exhausted conservative treatment modalities We have recommended non-steroidal anti-inflammatories, physical therapy, activity  modification and weight loss.  I counseled the patient on weight management.  In addition to the many health benefits of weight reduction we extensively spoke about the benefits to the joints.  This includes decrease stress across the joint which helps decrease symptomatology. Additionally we provided the patient a referral to weight management. I provided her a prescription for flexeril and tramadol We will plan on re-assessing the status in 6-12 months, earlier if symptoms worsen.     ?___________________________  Artist Sharps MD ?      Electronically signed by: Artist Lynwood Sharps, MD 04/26/20 1329

## 2020-07-01 ENCOUNTER — Other Ambulatory Visit: Payer: Self-pay

## 2020-07-01 ENCOUNTER — Ambulatory Visit
Admission: EM | Admit: 2020-07-01 | Discharge: 2020-07-01 | Disposition: A | Discharge status: Home / Self Care | Payer: BC Managed Care – PPO

## 2020-07-01 ENCOUNTER — Encounter: Payer: Self-pay | Admitting: *Deleted

## 2020-07-01 DIAGNOSIS — N939 Abnormal uterine and vaginal bleeding, unspecified: Secondary | ICD-10-CM | POA: Diagnosis not present

## 2020-07-01 HISTORY — DX: Other chronic pain: G89.29

## 2020-07-01 HISTORY — DX: Post-traumatic stress disorder, unspecified: F43.10

## 2020-07-01 HISTORY — DX: Depression, unspecified: F32.A

## 2020-07-01 HISTORY — DX: Anxiety disorder, unspecified: F41.9

## 2020-07-01 LAB — POCT URINE PREGNANCY: Preg Test, Ur: NEGATIVE

## 2020-07-01 MED ORDER — MELOXICAM 15 MG PO TABS: 15.0000 mg | ORAL_TABLET | Freq: Every day | ORAL | 0 refills | Status: AC

## 2020-07-01 NOTE — ED Triage Notes (Signed)
State had IUD x 5 yrs, removed 3 months ago; since then, reports irregular menses with some heavy vaginal bleeding.  States was seen at school clinic last week and "had blood levels checked, and was told to come back this week if still bleeding, but clinic is closed now".  C/O low abd and low back cramping.
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
## 2020-07-13 ENCOUNTER — Other Ambulatory Visit: Payer: Self-pay | Admitting: Obstetrics

## 2020-07-13 DIAGNOSIS — N83209 Unspecified ovarian cyst, unspecified side: Secondary | ICD-10-CM

## 2020-07-27 ENCOUNTER — Ambulatory Visit
Admission: RE | Admit: 2020-07-27 | Discharge: 2020-07-27 | Disposition: A | Discharge status: Home / Self Care | Payer: 59 | Source: Ambulatory Visit | Attending: Obstetrics | Admitting: Obstetrics

## 2020-07-27 DIAGNOSIS — N83209 Unspecified ovarian cyst, unspecified side: Secondary | ICD-10-CM

## 2020-08-18 ENCOUNTER — Encounter: Payer: Self-pay | Admitting: Obstetrics and Gynecology

## 2020-08-18 ENCOUNTER — Ambulatory Visit (INDEPENDENT_AMBULATORY_CARE_PROVIDER_SITE_OTHER): Payer: 59 | Admitting: Obstetrics and Gynecology

## 2020-08-18 ENCOUNTER — Other Ambulatory Visit: Payer: Self-pay

## 2020-08-18 VITALS — BP 131/84 | HR 95 | Ht 66.0 in | Wt 313.9 lb

## 2020-08-18 DIAGNOSIS — N939 Abnormal uterine and vaginal bleeding, unspecified: Secondary | ICD-10-CM | POA: Insufficient documentation

## 2020-08-18 MED ORDER — NORETHINDRONE ACETATE 5 MG PO TABS: 5.0000 mg | ORAL_TABLET | Freq: Two times a day (BID) | ORAL | 0 refills | Status: AC
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
## 2020-09-16 ENCOUNTER — Other Ambulatory Visit: Payer: Self-pay

## 2020-09-16 ENCOUNTER — Other Ambulatory Visit: Payer: Self-pay | Admitting: *Deleted

## 2020-09-16 ENCOUNTER — Ambulatory Visit
Admission: RE | Admit: 2020-09-16 | Discharge: 2020-09-16 | Disposition: A | Discharge status: Home / Self Care | Payer: Disability Insurance | Source: Ambulatory Visit | Attending: *Deleted | Admitting: *Deleted

## 2020-09-16 ENCOUNTER — Ambulatory Visit
Admission: RE | Admit: 2020-09-16 | Discharge: 2020-09-16 | Disposition: A | Discharge status: Home / Self Care | Payer: Disability Insurance | Attending: Diagnostic Radiology | Admitting: Diagnostic Radiology

## 2020-09-16 DIAGNOSIS — R609 Edema, unspecified: Secondary | ICD-10-CM

## 2020-10-11 ENCOUNTER — Other Ambulatory Visit: Payer: Self-pay | Admitting: *Deleted

## 2020-10-11 DIAGNOSIS — M25451 Effusion, right hip: Secondary | ICD-10-CM

## 2021-06-28 ENCOUNTER — Ambulatory Visit: Payer: Disability Insurance | Admitting: Internal Medicine

## 2022-06-09 DIAGNOSIS — Z419 Encounter for procedure for purposes other than remedying health state, unspecified: Secondary | ICD-10-CM | POA: Diagnosis not present

## 2022-07-10 DIAGNOSIS — Z419 Encounter for procedure for purposes other than remedying health state, unspecified: Secondary | ICD-10-CM | POA: Diagnosis not present

## 2022-08-09 DIAGNOSIS — F411 Generalized anxiety disorder: Secondary | ICD-10-CM | POA: Diagnosis not present

## 2022-08-09 DIAGNOSIS — F900 Attention-deficit hyperactivity disorder, predominantly inattentive type: Secondary | ICD-10-CM | POA: Diagnosis not present

## 2022-08-25 DIAGNOSIS — F411 Generalized anxiety disorder: Secondary | ICD-10-CM | POA: Diagnosis not present

## 2022-08-25 DIAGNOSIS — F900 Attention-deficit hyperactivity disorder, predominantly inattentive type: Secondary | ICD-10-CM | POA: Diagnosis not present
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
## 2023-01-18 NOTE — Progress Notes (Signed)
 Otolaryngology Office Visit Note  Seen for evaluation and concern for a ear      problem.   Assessment and Plan Problem List Items Addressed This Visit     Bilateral impacted cerumen - Primary    Concern for wax in ear(s). No ear pain, drainage. No headaches or dizziness. Hearing is down. Exam: Bilateral cerumen impaction removed. Subjective hearing opened up afterwards. TMs are clear and mobile.   Plan: follow up as needed.  CERUMEN REMOVAL The risks and benefits of this procedure have been thoroughly discussed with the patient/parent.  The most commons risks outlined included but were not limited to: injury of the ear canal or tympanic membrane.  The patient/parent was further informed that there are other less common risks.  The patient/parent  was given the opportunity to ask questions and all such questions were answered to the patient/parent's satisfaction.  Patient/parent  acknowledged the risks and has agreed to proceed.   Preop Diagnosis:  Complete obstruction of ear canal with cerumen impaction, Bilateral ears Procedure:  Under direct vision using various instruments (curettes, suction), cerumen was removed from the external auditory canal(s) Tolerance: Adequate Unplanned interventions: None Unplanned events: No complications  Ear Cerumen Removal  Date/Time: 01/18/2023 10:21 AM  Performed by: Odella CHRISTELLA Duverney, PA-C Authorized by: Odella CHRISTELLA Duverney, PA-C   Procedure Details:  Impacted cerumen: Yes Location details: bilateral Procedure type: curette  Post Procedure Details:  Procedure findings: complete impaction removal Patient tolerance of procedure: patient tolerated the procedure without difficulty              HPI Chief Complaint  Patient presents with  . Cerumen Impaction    New patient here today with bilateral cerumen states they feel scaly     ROS See HPI  Allergies Allergies  Allergen Reactions  . Amoxicillin Hives and Other (See Comments)     Throat swells  . Mupirocin Other (See Comments)    Skin rash and burning sensation, Skin rash and burning sensation, Skin rash and burning sensation    Current Meds  Current Outpatient Medications:  .  buPROPion (WELLBUTRIN) 100 mg tablet, Take 1 tablet by mouth in the morning and 1 tablet in the evening., Disp: , Rfl:  .  cyclobenzaprine (FLEXERIL) 10 mg tablet, Take 10 mg by mouth 3 (three) times a day as needed., Disp: , Rfl:  .  fluticasone propionate (FLONASE) 50 mcg/spray nasal spray, 2 sprays., Disp: , Rfl:  .  hydrOXYzine (ATARAX) 25 mg tablet, Take 25 mg by mouth every 4 (four) hours as needed., Disp: , Rfl:  .  venlafaxine (EFFEXOR XR) 37.5 mg 24 hr capsule, Take 75 mg by mouth., Disp: , Rfl:   Active Problems Patient Active Problem List  Diagnosis  . Bilateral impacted cerumen    PSH Past Surgical History:  Procedure Laterality Date  . HERNIA REPAIR     Procedure: HERNIA REPAIR  . OTHER SURGICAL HISTORY Left    Procedure: OTHER SURGICAL HISTORY (hip surgery); hip pinning (3). osteotomy, plate removal    Family Hx No family history on file.  Social Hx Social History   Socioeconomic History  . Marital status: Single    Spouse name: Not on file  . Number of children: Not on file  . Years of education: Not on file  . Highest education level: Not on file  Occupational History  . Not on file  Tobacco Use  . Smoking status: Never  . Smokeless tobacco: Never  Substance and  Sexual Activity  . Alcohol use: Not on file  . Drug use: Not on file  . Sexual activity: Not on file  Other Topics Concern  . Not on file  Social History Narrative  . Not on file   Social Determinants of Health   Food Insecurity: Not on file  Transportation Needs: Not on file  Safety: Not on file  Living Situation: Not on file    Vital Signs Vitals:   01/18/23 1003  BP: 136/84  Pulse: 87  Temp: 97.5 F (36.4 C)    Physical Exam   EXAMINATION  Constitutional General  Appearance: Well appearing in no acute distress Ability to Communicate: Age appropriate Pain: None at this time  Head & Face Inspection:  Normocephalic  Eyes Ocular Motility: Extraocular muscles intact   Ears Right EXT: Normal Right ZJR:[id number] Right TM: clear and mobile Left EXT: Normal Left EAC: clear Left TM:  clear and mobile     Nose Ext Nose: Dorsum midline Intranasal: no lesions or purulence  Oral Cavity, Mouth  Pharynx Lips/Teeth/Gums:  Oral Mucosa:  Tongue/Floor of Mouth:  Palate/Uvula: clear, no exudates, vesicles, or erythema  Tonsils: non obstructive Posterior Pharynx:  Pharyngeal Walls:  Larynx:  Nasopharynx:   Cervical Neck: Normal appearance Thyroid:   Lymphatic Neck Nodes:    Respiratory/Cardiac      Neuro/Psych Cranial Nerves:  Orientation: Age appropriate Mood & Affect: Age appropriate  GI/GU   Skin No facial lesions  Musculoskeletal   Extremities    PROCEDURE:      Odella HERO. Dittmer, PAC

## 2024-06-13 ENCOUNTER — Emergency Department: Admission: EM | Admit: 2024-06-13 | Discharge: 2024-06-13 | Disposition: A | Discharge status: Home / Self Care

## 2024-06-13 ENCOUNTER — Emergency Department

## 2024-06-13 ENCOUNTER — Encounter: Payer: Self-pay | Admitting: Emergency Medicine

## 2024-06-13 ENCOUNTER — Other Ambulatory Visit: Payer: Self-pay

## 2024-06-13 DIAGNOSIS — J45901 Unspecified asthma with (acute) exacerbation: Secondary | ICD-10-CM | POA: Insufficient documentation

## 2024-06-13 LAB — BASIC METABOLIC PANEL WITH GFR
Anion gap: 11 (ref 5–15)
BUN: 11 mg/dL (ref 6–20)
CO2: 24 mmol/L (ref 22–32)
Calcium: 8.6 mg/dL — ABNORMAL LOW (ref 8.9–10.3)
Chloride: 105 mmol/L (ref 98–111)
Creatinine, Ser: 0.8 mg/dL (ref 0.44–1.00)
GFR, Estimated: >60 mL/min (ref >60)
Glucose, Bld: 94 mg/dL (ref 70–99)
Potassium: 3.5 mmol/L (ref 3.5–5.1)
Sodium: 140 mmol/L (ref 135–145)

## 2024-06-13 LAB — CBC WITH DIFFERENTIAL/PLATELET
Abs Immature Granulocytes: 0.01 K/uL (ref 0.00–0.07)
Basophils Absolute: 0 K/uL (ref 0.0–0.1)
Basophils Relative: 0 %
Eosinophils Absolute: 0 K/uL (ref 0.0–0.5)
Eosinophils Relative: 1 %
HCT: 36.1 % (ref 36.0–46.0)
Hemoglobin: 11.9 g/dL — ABNORMAL LOW (ref 12.0–15.0)
Immature Granulocytes: 0 %
Lymphocytes Relative: 40 %
Lymphs Abs: 2.3 K/uL (ref 0.7–4.0)
MCH: 27.7 pg (ref 26.0–34.0)
MCHC: 33 g/dL (ref 30.0–36.0)
MCV: 84.1 fL (ref 80.0–100.0)
Monocytes Absolute: 0.6 K/uL (ref 0.1–1.0)
Monocytes Relative: 10 %
Neutro Abs: 2.9 K/uL (ref 1.7–7.7)
Neutrophils Relative %: 49 %
Platelets: 267 K/uL (ref 150–400)
RBC: 4.29 MIL/uL (ref 3.87–5.11)
RDW: 13.7 % (ref 11.5–15.5)
WBC: 5.9 K/uL (ref 4.0–10.5)
nRBC: 0 % (ref 0.0–0.2)

## 2024-06-13 LAB — RESP PANEL BY RT-PCR (RSV, FLU A&B, COVID)  RVPGX2
Influenza A by PCR: NEGATIVE
Influenza B by PCR: NEGATIVE
Resp Syncytial Virus by PCR: NEGATIVE
SARS Coronavirus 2 by RT PCR: NEGATIVE

## 2024-06-13 LAB — PROCALCITONIN: Procalcitonin: <0.1 ng/mL

## 2024-06-13 LAB — GROUP A STREP BY PCR: Group A Strep by PCR: NOT DETECTED

## 2024-06-13 MED ORDER — IPRATROPIUM-ALBUTEROL 0.5-2.5 (3) MG/3ML IN SOLN
3.0000 mL | Freq: Once | RESPIRATORY_TRACT | Status: AC
  Administered 2024-06-13: 3 mL via RESPIRATORY_TRACT
  Filled 2024-06-13: qty 3

## 2024-06-13 MED ORDER — ALBUTEROL SULFATE 0.63 MG/3ML IN NEBU: 1.0000 | INHALATION_SOLUTION | Freq: Four times a day (QID) | RESPIRATORY_TRACT | 12 refills | Status: AC | PRN

## 2024-06-13 MED ORDER — PREDNISONE 20 MG PO TABS: 20.0000 mg | ORAL_TABLET | Freq: Two times a day (BID) | ORAL | 0 refills | Status: AC

## 2024-06-13 MED ORDER — METHYLPREDNISOLONE SODIUM SUCC 125 MG IJ SOLR
125.0000 mg | Freq: Once | INTRAMUSCULAR | Status: AC
  Administered 2024-06-13: 125 mg via INTRAVENOUS
  Filled 2024-06-13: qty 2

## 2024-06-13 NOTE — ED Triage Notes (Signed)
 Pt arrives POV ambulatory to triage, gait steady, no acute distress noted c/o reoccurring asthma exacerbations; pt has been on neb treatments and steroids for two months. Pt reports being sick last week and not getting any better with the regimen she is on, c/o increased coughing, sob, and wheezing. No audible wheezing noted. Pt report soreness in throat and rib cage from coughing

## 2024-06-13 NOTE — ED Provider Notes (Signed)
 [facility] Provider Note    Event Date/Time   First MD Initiated Contact with Patient 06/13/24 2104     (approximate)   History   Cough, Sore Throat, and Shortness of Breath   HPI  Marcia Glenn is a 28 y.o. female with asthma who presents to the emergency department with 2 weeks of progressively worsening shortness of breath.  Patient reports compliance with using her steroid inhaler and her albuterol  inhaler and reports that her symptoms of worsened.  She does have access to an travel nebulizer but has not utilized such.  She has been admitted for asthma exacerbations previously as a child but has never required intubation.  She just recently moved from [place]  to [facility]  and has not established a primary care physician.  Denies any smoking.  Denies any recent or steroids.  She reports her main symptoms is cough and shortness of breath with exertion.  Denies any chest pain abdominal pain nausea vomiting changes in urinary bowel habits or fevers      Physical Exam   Triage Vital Signs: ED Triage Vitals [06/13/24 2017]  Encounter Vitals Group     BP (!) 140/101     Girls Systolic BP Percentile      Girls Diastolic BP Percentile      Boys Systolic BP Percentile      Boys Diastolic BP Percentile      Pulse Rate 78     Resp 18     Temp 99.3 F (37.4 C)     Temp Source Oral     SpO2 98 %     Weight      Height      Head Circumference      Peak Flow      Pain Score 6     Pain Loc      Pain Education      Exclude from Growth Chart     Most recent vital signs: Vitals:   06/13/24 2258 06/13/24 2329  BP: 117/82   Pulse: 64   Resp: 18   Temp: 98.7 F (37.1 C)   SpO2: 100% 99%    Nursing Triage Note reviewed. Vital signs reviewed and patients oxygen saturation is normoxic  General: Patient is well nourished, well developed, awake and alert, resting comfortably in no acute distress Head: Normocephalic and atraumatic Eyes:  Normal inspection, extraocular muscles intact, no conjunctival pallor Ear, nose, throat: Normal external exam Neck: Normal range of motion Respiratory: Patient is in no respiratory distress, lungs with wheezes in all quadrants but moving good air Cardiovascular: Patient is not tachycardic, RRR without murmur appreciated GI: Abd SNT with no guarding or rebound  Back: Normal inspection of the back with good strength and range of motion throughout all ext Extremities: pulses intact with good cap refills, no LE pitting edema or calf tenderness Neuro: The patient is alert and oriented to person, place, and time, appropriately conversive, with 5/5 bilat UE/LE strength, no gross motor or sensory defects noted. Coordination appears to be adequate. Skin: Warm, dry, and intact Psych: normal mood and affect, no SI or HI  ED Results / Procedures / Treatments   Labs (all labs ordered are listed, but only abnormal results are displayed) Labs Reviewed  CBC WITH DIFFERENTIAL/PLATELET - Abnormal; Notable for the following components:      Result Value   Hemoglobin 11.9 (*)    All other components within normal limits  BASIC METABOLIC PANEL WITH GFR -  Abnormal; Notable for the following components:   Calcium 8.6 (*)    All other components within normal limits  RESP PANEL BY RT-PCR (RSV, FLU A&B, COVID)  RVPGX2  GROUP A STREP BY PCR  PROCALCITONIN  POC URINE PREG, ED     EKG EKG and rhythm strip are interpreted by myself:   EKG: [Normal sinus rhythm] at heart rate of 80, normal QRS duration, QTc 357, nonspecific ST segments and T waves no ectopy EKG not consistent with Acute STEMI Rhythm strip: NSR in lead II    RADIOLOGY Xray chest: No acute abnormality on my independent review interpretation radiologist agrees    PROCEDURES:  Critical Care performed: No  Procedures   MEDICATIONS ORDERED IN ED: Medications  methylPREDNISolone  sodium succinate (SOLU-MEDROL ) 125 mg/2 mL injection  125 mg (125 mg Intravenous Given 06/13/24 2139)  ipratropium-albuterol  (DUONEB) 0.5-2.5 (3) MG/3ML nebulizer solution 3 mL (3 mLs Nebulization Given 06/13/24 2139)  ipratropium-albuterol  (DUONEB) 0.5-2.5 (3) MG/3ML nebulizer solution 3 mL (3 mLs Nebulization Given 06/13/24 2124)     IMPRESSION / MDM / ASSESSMENT AND PLAN / ED COURSE                                Differential diagnosis includes, but is not limited to, asthma exacerbation, pneumonia, upper respiratory infection, anemia, electrolyte derangement   ED course: Patient presents and does have evidence of mild bronchospasm.  Likely she is satting 98% on room air.  Group A strep not detected and she had no positive results on her respiratory panel.  She does not have a leukocytosis and is not profoundly anemic and has no electrolyte derangements.  She received 125 mg of IV Solu-Medrol  and 2 DuoNebs in succession with complete resolve of her symptoms.  She feels comfortable returning home.  I did place a outpatient referral to primary care physician.  I will send her home with 4 days of 20 mg twice daily of prednisone  and refills of her albuterol  nebulizer.  She will return if any acutely worsening symptoms   Clinical Course as of 06/14/24 0005  Fri Jun 13, 2024  2111 SARS Coronavirus 2 by RT PCR: NEGATIVE Negative [HD]  2239 Patient reexamined, she feels improved.  Repeat pulmonary exam without any wheezing.  Will send the patient home on a 5-day course of steroids.  All questions answered and patient voiced understanding and requested discharge [HD]    Clinical Course User Index [HD] Nicholaus Rolland BRAVO, MD   At time of discharge there is no evidence of acute life, limb, vision, or fertility threat. Patient has stable vital signs, pain is well controlled, patient is ambulatory and p.o. tolerant.  Discharge instructions were completed using the EPIC system. I would refer you to those at this time. All warnings prescriptions follow-up  etc. were discussed in detail with the patient. Patient indicates understanding and is agreeable with this plan. All questions answered.  Patient is made aware that they may return to the emergency department for any worsening or new condition or for any other emergency.  -- Risk: 5 This patient has a high risk of morbidity due to further diagnostic testing or treatment. Rationale: This patient's evaluation and management involve a high risk of morbidity due to the potential severity of presenting symptoms, need for diagnostic testing, and/or initiation of treatment that may require close monitoring. The differential includes conditions with potential for significant deterioration or requiring escalation of  care. Treatment decisions in the ED, including medication administration, procedural interventions, or disposition planning, reflect this level of risk. COPA: 5 The patient has the following acute or chronic illness/injury that poses a possible threat to life or bodily function: [X] : The patient has a potentially serious acute condition or an acute exacerbation of a chronic illness requiring urgent evaluation and management in the Emergency Department. The clinical presentation necessitates immediate consideration of life-threatening or function-threatening diagnoses, even if they are ultimately ruled out.   FINAL CLINICAL IMPRESSION(S) / ED DIAGNOSES   Final diagnoses:  Moderate asthma with exacerbation, unspecified whether persistent     Rx / DC Orders   ED Discharge Orders          Ordered    predniSONE  (DELTASONE ) 20 MG tablet  2 times daily with meals        06/13/24 2241    albuterol  (ACCUNEB ) 0.63 MG/3ML nebulizer solution  Every 6 hours PRN        06/13/24 2241    Ambulatory Referral to Primary Care (Establish Care)        06/13/24 2241             Note:  This document was prepared using Dragon voice recognition software and may include unintentional dictation  errors.   Nicholaus Rolland BRAVO, MD 06/14/24 MIKI

## 2024-06-13 NOTE — Discharge Instructions (Signed)

## 2024-06-17 ENCOUNTER — Encounter: Payer: Self-pay | Admitting: *Deleted

## 2024-06-17 ENCOUNTER — Emergency Department: Admission: EM | Admit: 2024-06-17 | Discharge: 2024-06-17 | Disposition: A | Discharge status: Home / Self Care

## 2024-06-17 ENCOUNTER — Other Ambulatory Visit: Payer: Self-pay

## 2024-06-17 DIAGNOSIS — J4541 Moderate persistent asthma with (acute) exacerbation: Secondary | ICD-10-CM

## 2024-06-17 MED ORDER — GUAIFENESIN ER 600 MG PO TB12: 600.0000 mg | ORAL_TABLET | Freq: Two times a day (BID) | ORAL | 0 refills | Status: AC

## 2024-06-17 MED ORDER — IPRATROPIUM-ALBUTEROL 0.5-2.5 (3) MG/3ML IN SOLN: 3.0000 mL | RESPIRATORY_TRACT | 0 refills | Status: AC | PRN

## 2024-06-17 MED ORDER — IPRATROPIUM-ALBUTEROL 0.5-2.5 (3) MG/3ML IN SOLN
3.0000 mL | Freq: Once | RESPIRATORY_TRACT | Status: AC
  Administered 2024-06-17: 3 mL via RESPIRATORY_TRACT
  Filled 2024-06-17: qty 3

## 2024-06-17 MED ORDER — BENZONATATE 100 MG PO CAPS: 100.0000 mg | ORAL_CAPSULE | Freq: Three times a day (TID) | ORAL | 0 refills | Status: AC | PRN

## 2024-06-17 NOTE — ED Provider Notes (Signed)
 [facility] Provider Note    Event Date/Time   First MD Initiated Contact with Patient 06/17/24 1955     (approximate)   History   Cough   HPI  Marcia Glenn is a 28 y.o. female  with a past medical history of, anxiety, PTSD, depression, gastric bypass presents to the emergency department with 2 weeks of worsening cough and intermittent SOB.  States the cough expels some mucous at times but is mostly dry. Reports she was sick recently a few weeks ago when the symptoms started.  Reports uses her albuterol  inhaler and her steroid inhalers at home without much relief.  She was admitted for asthma exacerbations as a child, but did not require intubation.  She did move recently from [place]  to [place] [employer], [place] and does not have a PCP appointment until next year.  She denies fevers, chest pain, abdominal pain, sore throat.  She does not smoke.  I reviewed the medical chart.  She was seen on 12/5, had EKG and labs as well as chest x-ray, diagnosed with asthma exacerbation and sent home with 5-day steroid course, albuterol , and primary care referral.  Physical Exam   Triage Vital Signs: ED Triage Vitals  Encounter Vitals Group     BP 06/17/24 1849 (!) 147/103     Girls Systolic BP Percentile --      Girls Diastolic BP Percentile --      Boys Systolic BP Percentile --      Boys Diastolic BP Percentile --      Pulse Rate 06/17/24 1849 60     Resp 06/17/24 1849 20     Temp 06/17/24 1849 99.5 F (37.5 C)     Temp Source 06/17/24 1849 Oral     SpO2 06/17/24 1849 99 %     Weight 06/17/24 1847 218 lb (98.9 kg)     Height 06/17/24 1847 5' 5 (1.651 m)     Head Circumference --      Peak Flow --      Pain Score 06/17/24 1847 4     Pain Loc --      Pain Education --      Exclude from Growth Chart --     Most recent vital signs: Vitals:   06/17/24 2112 06/17/24 2130  BP: (!) 136/99 136/67  Pulse: (!) 58 60  Resp: 18 20  Temp:    SpO2: 100% 100%    General:  Awake, in no acute distress. Appears stated age. Head: Normocephalic, atraumatic. Eyes: No scleral icterus or conjunctival injection. Ears/Nose/Throat: TMs intact b/l. Nares patent, no nasal discharge. Oropharynx moist, no erythema or exudate. Dentition intact. Neck: Supple. CV: Good peripheral perfusion.  Respiratory:Normal respiratory effort.  No respiratory distress. CTAB. GI: Soft, non-distended, non-tender.  Skin:Warm, dry, intact. No rashes, lesions, or ecchymosis. No cyanosis or pallor. Neurological: A&Ox4 to person, place, time, and situation.   ED Results / Procedures / Treatments   Labs (all labs ordered are listed, but only abnormal results are displayed) Labs Reviewed - No data to display   EKG     RADIOLOGY CXR  FINDINGS: The heart size and mediastinal contours are within normal limits. Both lungs are clear. The visualized skeletal structures are unremarkable.   IMPRESSION: No active cardiopulmonary disease.   PROCEDURES:  Critical Care performed: No   Procedures   MEDICATIONS ORDERED IN ED: Medications  ipratropium-albuterol  (DUONEB) 0.5-2.5 (3) MG/3ML nebulizer solution 3 mL (3 mLs Nebulization Given 06/17/24 2013)  IMPRESSION / MDM / ASSESSMENT AND PLAN / ED COURSE  I reviewed the triage vital signs and the nursing notes.                              Differential diagnosis includes, but is not limited to, asthma exacerbation, pneumonia, pleural effusion, bronchitis  Patient's presentation is most consistent with acute complicated illness / injury requiring diagnostic workup.  Patient is a 28 year old female presenting for cough that has been present for 2 weeks.  She has been using the medications at home without relief.  She is satting at 99%, respiratory rate of 18-20. CXR ordered in triage. I independently viewed the x-ray and radiologist's report.  I agree with the radiologist's report there are no acute findings.She did not have any  wheezing on exam but I did provide 1 of DuoNeb to help with her acute cough and SOB. She reports she is feeling better after the DuoNeb.  Told her to continue her medications at home and provided her a DuoNeb to use as needed as well as Mucinex  and Tessalon  Perles. Will have her follow-up with pulmonology outpatient in hopes of getting an earlier appointment for possible adjustment of asthma medications compared to the primary care provider's office that she is scheduled with.  The patient may return to the emergency department for any new, worsening, or concerning symptoms. Patient was given the opportunity to ask questions; all questions were answered. Emergency department return precautions were discussed with the patient.  Patient is in agreement to the treatment plan.  Patient is stable for discharge.   FINAL CLINICAL IMPRESSION(S) / ED DIAGNOSES   Final diagnoses:  Moderate persistent asthma with exacerbation     Rx / DC Orders   ED Discharge Orders          Ordered    ipratropium-albuterol  (DUONEB) 0.5-2.5 (3) MG/3ML SOLN  Every 4 hours PRN        06/17/24 2130    guaiFENesin  (MUCINEX ) 600 MG 12 hr tablet  2 times daily        06/17/24 2130    benzonatate  (TESSALON ) 100 MG capsule  3 times daily PRN        06/17/24 2130             Note:  This document was prepared using Dragon voice recognition software and may include unintentional dictation errors.     Sheron Salm, PA-C 06/17/24 2149    Nicholaus Rolland BRAVO, MD 06/17/24 [phone]

## 2024-06-17 NOTE — Discharge Instructions (Addendum)
 We believe that your symptoms are caused today by an exacerbation of your asthma.  Please take the prescribed medications and any medications that you have at home.  Follow up with your doctor as recommended.  If you develop any new or worsening symptoms, including but not limited to fever, persistent vomiting, worsening shortness of breath, or other symptoms that concern you, please return to the Emergency Department immediately.   Follow up with the pulmonologist listed in this paperwork (Dr. Isadora). Give their office a call to establish care.

## 2024-06-17 NOTE — ED Triage Notes (Addendum)
 Pt ambulatory to triage.  Pt has a cough.  Hx asthma.  Pt reports using inhaler without relief.  Sx for 4 days.  Pt alert  speech clear.  Pt seen here a few days ago with similar sx .  Pt taking steroids now with no relief per pt.

## 2024-06-25 ENCOUNTER — Ambulatory Visit: Admitting: Pulmonary Disease
